# Patient Record
Sex: Female | Born: 1990 | Race: White | Hispanic: No | Marital: Married | Smoking: Never smoker
Health system: Southern US, Community
[De-identification: ages and names within clinical notes are randomized; demographics above are authoritative.]

## PROBLEM LIST (undated history)

## (undated) DIAGNOSIS — Z789 Other specified health status: Secondary | ICD-10-CM

## (undated) HISTORY — PX: NO PAST SURGERIES: SHX2092

---

## 2017-06-21 LAB — OB RESULTS CONSOLE GC/CHLAMYDIA
CHLAMYDIA, DNA PROBE: NEGATIVE
GC PROBE AMP, GENITAL: NEGATIVE

## 2017-06-21 LAB — OB RESULTS CONSOLE RUBELLA ANTIBODY, IGM: RUBELLA: IMMUNE

## 2017-06-21 LAB — OB RESULTS CONSOLE ANTIBODY SCREEN: ANTIBODY SCREEN: NEGATIVE

## 2017-06-21 LAB — OB RESULTS CONSOLE ABO/RH: RH Type: POSITIVE

## 2017-06-21 LAB — OB RESULTS CONSOLE HEPATITIS B SURFACE ANTIGEN: HEP B S AG: NEGATIVE

## 2017-06-21 LAB — OB RESULTS CONSOLE RPR: RPR: NONREACTIVE

## 2017-06-21 LAB — OB RESULTS CONSOLE HIV ANTIBODY (ROUTINE TESTING): HIV: NONREACTIVE

## 2017-07-13 ENCOUNTER — Other Ambulatory Visit (HOSPITAL_COMMUNITY)
Admission: RE | Admit: 2017-07-13 | Discharge: 2017-07-13 | Disposition: A | Discharge status: Home / Self Care | Payer: BLUE CROSS/BLUE SHIELD | Source: Ambulatory Visit | Attending: Obstetrics and Gynecology | Admitting: Obstetrics and Gynecology

## 2017-07-13 ENCOUNTER — Other Ambulatory Visit: Payer: Self-pay | Admitting: Obstetrics and Gynecology

## 2017-07-13 DIAGNOSIS — Z3400 Encounter for supervision of normal first pregnancy, unspecified trimester: Secondary | ICD-10-CM | POA: Insufficient documentation

## 2017-07-19 LAB — CYTOLOGY - PAP
Chlamydia: NEGATIVE
HPV (WINDOPATH): DETECTED
NEISSERIA GONORRHEA: NEGATIVE

## 2017-12-13 NOTE — L&D Delivery Note (Signed)
Delivery Note At 7:46 AM a viable female was delivered via Vaginal, Spontaneous (Presentation: ROA ).  APGAR: 8, 9; weight 7 lb 12.9 oz (3540 g).   Placenta status: L&D .  Cord:  with the following complications: none .  Cord pH: n/a  Anesthesia:  Epidural Episiotomy: Right Mediolateral Lacerations: 3rd degree (3b) with >50% of EAS torn.  Rectal exploration was performed- mucosa and internal sphincter intact.  Repair was performed in the usual fashion.  Interrupted suture of 3-0 vicryl was used for re-approximation of the EAS.  The remainder of the repair was performed in the usual fashion.  Pt was given IV Ancef x 1  Suture Repair: 2.0 3.0 vicryl Est. Blood Loss (mL): 250  Mom to postpartum.  Baby to Couplet care / Skin to Skin.  Sharon SellerJennifer M Sahir Tolson 02/09/2018, 9:51 AM

## 2018-01-03 LAB — OB RESULTS CONSOLE GBS: GBS: NEGATIVE

## 2018-02-03 ENCOUNTER — Encounter (HOSPITAL_COMMUNITY): Payer: Self-pay | Admitting: *Deleted

## 2018-02-03 ENCOUNTER — Telehealth (HOSPITAL_COMMUNITY): Payer: Self-pay | Admitting: *Deleted

## 2018-02-06 NOTE — Telephone Encounter (Signed)
Preadmission screen  

## 2018-02-07 ENCOUNTER — Ambulatory Visit (HOSPITAL_COMMUNITY)
Admission: RE | Admit: 2018-02-07 | Discharge: 2018-02-07 | Disposition: A | Discharge status: Home / Self Care | Payer: Managed Care, Other (non HMO) | Source: Ambulatory Visit | Attending: Obstetrics and Gynecology | Admitting: Obstetrics and Gynecology

## 2018-02-07 ENCOUNTER — Other Ambulatory Visit (HOSPITAL_COMMUNITY): Payer: Self-pay | Admitting: Obstetrics and Gynecology

## 2018-02-07 DIAGNOSIS — Z3A41 41 weeks gestation of pregnancy: Secondary | ICD-10-CM

## 2018-02-07 DIAGNOSIS — O480 Post-term pregnancy: Secondary | ICD-10-CM

## 2018-02-08 ENCOUNTER — Encounter (HOSPITAL_COMMUNITY): Payer: Self-pay

## 2018-02-08 ENCOUNTER — Inpatient Hospital Stay (HOSPITAL_COMMUNITY)
Admission: AD | Admit: 2018-02-08 | Discharge: 2018-02-08 | Disposition: A | Discharge status: Home / Self Care | Payer: Managed Care, Other (non HMO) | Source: Ambulatory Visit | Attending: Obstetrics & Gynecology | Admitting: Obstetrics & Gynecology

## 2018-02-08 ENCOUNTER — Inpatient Hospital Stay (HOSPITAL_COMMUNITY)
Admission: AD | Admit: 2018-02-08 | Discharge: 2018-02-11 | DRG: 768 | Disposition: A | Discharge status: Home / Self Care | Payer: Managed Care, Other (non HMO) | Source: Ambulatory Visit | Attending: Obstetrics and Gynecology | Admitting: Obstetrics and Gynecology

## 2018-02-08 ENCOUNTER — Inpatient Hospital Stay (HOSPITAL_COMMUNITY): Payer: Managed Care, Other (non HMO)

## 2018-02-08 DIAGNOSIS — O480 Post-term pregnancy: Secondary | ICD-10-CM | POA: Diagnosis present

## 2018-02-08 DIAGNOSIS — O288 Other abnormal findings on antenatal screening of mother: Secondary | ICD-10-CM

## 2018-02-08 DIAGNOSIS — Z3A41 41 weeks gestation of pregnancy: Secondary | ICD-10-CM | POA: Diagnosis not present

## 2018-02-08 HISTORY — DX: Other specified health status: Z78.9

## 2018-02-08 LAB — CBC
HEMATOCRIT: 38.8 % (ref 36.0–46.0)
Hemoglobin: 13.1 g/dL (ref 12.0–15.0)
MCH: 29.6 pg (ref 26.0–34.0)
MCHC: 33.8 g/dL (ref 30.0–36.0)
MCV: 87.8 fL (ref 78.0–100.0)
PLATELETS: 335 10*3/uL (ref 150–400)
RBC: 4.42 MIL/uL (ref 3.87–5.11)
RDW: 13.8 % (ref 11.5–15.5)
WBC: 19.4 10*3/uL — AB (ref 4.0–10.5)

## 2018-02-08 MED ORDER — HYDROXYZINE HCL 50 MG PO TABS
50.0000 mg | ORAL_TABLET | Freq: Four times a day (QID) | ORAL | Status: DC | PRN
  Filled 2018-02-08: qty 1

## 2018-02-08 MED ORDER — OXYCODONE-ACETAMINOPHEN 5-325 MG PO TABS: 2.0000 | ORAL_TABLET | ORAL | Status: DC | PRN

## 2018-02-08 MED ORDER — EPHEDRINE 5 MG/ML INJ
10.0000 mg | INTRAVENOUS | Status: DC | PRN
  Filled 2018-02-08: qty 2

## 2018-02-08 MED ORDER — PHENYLEPHRINE 40 MCG/ML (10ML) SYRINGE FOR IV PUSH (FOR BLOOD PRESSURE SUPPORT)
80.0000 ug | PREFILLED_SYRINGE | INTRAVENOUS | Status: DC | PRN
  Filled 2018-02-08: qty 5

## 2018-02-08 MED ORDER — LIDOCAINE HCL (PF) 1 % IJ SOLN
30.0000 mL | INTRAMUSCULAR | Status: DC | PRN
  Filled 2018-02-08: qty 30

## 2018-02-08 MED ORDER — LACTATED RINGERS IV SOLN
500.0000 mL | Freq: Once | INTRAVENOUS | Status: AC
  Administered 2018-02-08: 500 mL via INTRAVENOUS

## 2018-02-08 MED ORDER — OXYCODONE-ACETAMINOPHEN 5-325 MG PO TABS: 1.0000 | ORAL_TABLET | ORAL | Status: DC | PRN

## 2018-02-08 MED ORDER — ONDANSETRON HCL 4 MG/2ML IJ SOLN: 4.0000 mg | Freq: Four times a day (QID) | INTRAMUSCULAR | Status: DC | PRN

## 2018-02-08 MED ORDER — FENTANYL 2.5 MCG/ML BUPIVACAINE 1/10 % EPIDURAL INFUSION (WH - ANES)
14.0000 mL/h | INTRAMUSCULAR | Status: DC | PRN
  Administered 2018-02-09: 14 mL/h via EPIDURAL
  Filled 2018-02-08: qty 100

## 2018-02-08 MED ORDER — LACTATED RINGERS IV SOLN
INTRAVENOUS | Status: DC
  Administered 2018-02-08: via INTRAVENOUS

## 2018-02-08 MED ORDER — OXYTOCIN BOLUS FROM INFUSION
500.0000 mL | Freq: Once | INTRAVENOUS | Status: AC
  Administered 2018-02-09: 500 mL via INTRAVENOUS

## 2018-02-08 MED ORDER — LACTATED RINGERS IV SOLN: 500.0000 mL | INTRAVENOUS | Status: DC | PRN

## 2018-02-08 MED ORDER — ACETAMINOPHEN 325 MG PO TABS: 650.0000 mg | ORAL_TABLET | ORAL | Status: DC | PRN

## 2018-02-08 MED ORDER — BUTORPHANOL TARTRATE 1 MG/ML IJ SOLN: 1.0000 mg | INTRAMUSCULAR | Status: DC | PRN

## 2018-02-08 MED ORDER — PHENYLEPHRINE 40 MCG/ML (10ML) SYRINGE FOR IV PUSH (FOR BLOOD PRESSURE SUPPORT)
80.0000 ug | PREFILLED_SYRINGE | INTRAVENOUS | Status: DC | PRN
  Filled 2018-02-08: qty 10
  Filled 2018-02-08: qty 5

## 2018-02-08 MED ORDER — DIPHENHYDRAMINE HCL 50 MG/ML IJ SOLN: 12.5000 mg | INTRAMUSCULAR | Status: DC | PRN

## 2018-02-08 MED ORDER — OXYTOCIN 40 UNITS IN LACTATED RINGERS INFUSION - SIMPLE MED
2.5000 [IU]/h | INTRAVENOUS | Status: DC
  Filled 2018-02-08: qty 1000

## 2018-02-08 MED ORDER — SOD CITRATE-CITRIC ACID 500-334 MG/5ML PO SOLN: 30.0000 mL | ORAL | Status: DC | PRN

## 2018-02-08 NOTE — MAU Note (Signed)
Pt reports worsening contractions every 3-5 mins apart. Pt denies LOF. Reports bloody show. Reports good fetal movement. Cervix was 2cm

## 2018-02-08 NOTE — MAU Note (Signed)
Pt reports contractions that became more consistent at 9pm. States they are every 3 mins. Pt denies LOF. Reports some spotting earlier in the night, but none now. Reports good fetal movement. States was 1.5cm in office today. Scheduled for induction on Friday

## 2018-02-08 NOTE — MAU Note (Signed)
I have communicated with Sue Thornton, CNM and reviewed vital signs:  Vitals:   02/08/18 0050 02/08/18 0410  BP: 123/75 124/70  Pulse: (!) 105   Resp: 20   Temp: 98 F (36.7 C)   SpO2: 100%     Vaginal exam:  Dilation: 2 Effacement (%): 80 Cervical Position: Middle Station: -1 Presentation: Vertex Exam by:: Roxan Hockeyraci Saroya Riccobono RN,   Also reviewed contraction pattern and that non-stress test is reactive.  It has been documented that patient is contracting every 2-7  minutes with 1/2 cm cervical change over 3  hours not indicating active labor.  Patient denies any other complaints.  Based on this report provider has given order for discharge.  A discharge order and diagnosis entered by a provider.   Labor discharge instructions reviewed with patient.

## 2018-02-08 NOTE — Discharge Instructions (Signed)
Braxton Wilcoxson Contractions °Contractions of the uterus can occur throughout pregnancy, but they are not always a sign that you are in labor. You may have practice contractions called Braxton Resurreccion contractions. These false labor contractions are sometimes confused with true labor. °What are Braxton Asaro contractions? °Braxton Bloomquist contractions are tightening movements that occur in the muscles of the uterus before labor. Unlike true labor contractions, these contractions do not result in opening (dilation) and thinning of the cervix. Toward the end of pregnancy (32-34 weeks), Braxton Gadison contractions can happen more often and may become stronger. These contractions are sometimes difficult to tell apart from true labor because they can be very uncomfortable. You should not feel embarrassed if you go to the hospital with false labor. °Sometimes, the only way to tell if you are in true labor is for your health care provider to look for changes in the cervix. The health care provider will do a physical exam and may monitor your contractions. If you are not in true labor, the exam should show that your cervix is not dilating and your water has not broken. °If there are other health problems associated with your pregnancy, it is completely safe for you to be sent home with false labor. You may continue to have Braxton Croke contractions until you go into true labor. °How to tell the difference between true labor and false labor °True labor °· Contractions last 30-70 seconds. °· Contractions become very regular. °· Discomfort is usually felt in the top of the uterus, and it spreads to the lower abdomen and low back. °· Contractions do not go away with walking. °· Contractions usually become more intense and increase in frequency. °· The cervix dilates and gets thinner. °False labor °· Contractions are usually shorter and not as strong as true labor contractions. °· Contractions are usually irregular. °· Contractions  are often felt in the front of the lower abdomen and in the groin. °· Contractions may go away when you walk around or change positions while lying down. °· Contractions get weaker and are shorter-lasting as time goes on. °· The cervix usually does not dilate or become thin. °Follow these instructions at home: °· Take over-the-counter and prescription medicines only as told by your health care provider. °· Keep up with your usual exercises and follow other instructions from your health care provider. °· Eat and drink lightly if you think you are going into labor. °· If Braxton Alguire contractions are making you uncomfortable: °? Change your position from lying down or resting to walking, or change from walking to resting. °? Sit and rest in a tub of warm water. °? Drink enough fluid to keep your urine pale yellow. Dehydration may cause these contractions. °? Do slow and deep breathing several times an hour. °· Keep all follow-up prenatal visits as told by your health care provider. This is important. °Contact a health care provider if: °· You have a fever. °· You have continuous pain in your abdomen. °Get help right away if: °· Your contractions become stronger, more regular, and closer together. °· You have fluid leaking or gushing from your vagina. °· You pass blood-tinged mucus (bloody show). °· You have bleeding from your vagina. °· You have low back pain that you never had before. °· You feel your baby’s head pushing down and causing pelvic pressure. °· Your baby is not moving inside you as much as it used to. °Summary °· Contractions that occur before labor are called Braxton   Feider contractions, false labor, or practice contractions. °· Braxton Botsford contractions are usually shorter, weaker, farther apart, and less regular than true labor contractions. True labor contractions usually become progressively stronger and regular and they become more frequent. °· Manage discomfort from Braxton Canino contractions by  changing position, resting in a warm bath, drinking plenty of water, or practicing deep breathing. °This information is not intended to replace advice given to you by your health care provider. Make sure you discuss any questions you have with your health care provider. °Document Released: 04/14/2017 Document Revised: 04/14/2017 Document Reviewed: 04/14/2017 °Elsevier Interactive Patient Education © 2018 [employer] ° °

## 2018-02-09 ENCOUNTER — Encounter (HOSPITAL_COMMUNITY): Payer: Self-pay

## 2018-02-09 ENCOUNTER — Inpatient Hospital Stay (HOSPITAL_COMMUNITY): Payer: Managed Care, Other (non HMO) | Admitting: Anesthesiology

## 2018-02-09 LAB — ABO/RH: ABO/RH(D): O POS

## 2018-02-09 LAB — TYPE AND SCREEN
ABO/RH(D): O POS
ANTIBODY SCREEN: NEGATIVE

## 2018-02-09 LAB — RPR: RPR Ser Ql: NONREACTIVE

## 2018-02-09 MED ORDER — OXYTOCIN 40 UNITS IN LACTATED RINGERS INFUSION - SIMPLE MED
1.0000 m[IU]/min | INTRAVENOUS | Status: DC
  Administered 2018-02-09: 2 m[IU]/min via INTRAVENOUS

## 2018-02-09 MED ORDER — CEFAZOLIN SODIUM-DEXTROSE 2-4 GM/100ML-% IV SOLN
2.0000 g | Freq: Once | INTRAVENOUS | Status: AC
  Administered 2018-02-09: 2 g via INTRAVENOUS
  Filled 2018-02-09: qty 100

## 2018-02-09 MED ORDER — PRENATAL MULTIVITAMIN CH
1.0000 | ORAL_TABLET | Freq: Every day | ORAL | Status: DC
  Administered 2018-02-09 – 2018-02-11 (×3): 1 via ORAL
  Filled 2018-02-09 (×3): qty 1

## 2018-02-09 MED ORDER — ACETAMINOPHEN 325 MG PO TABS
650.0000 mg | ORAL_TABLET | ORAL | Status: DC | PRN
  Administered 2018-02-09 (×2): 650 mg via ORAL
  Filled 2018-02-09 (×2): qty 2

## 2018-02-09 MED ORDER — SENNOSIDES-DOCUSATE SODIUM 8.6-50 MG PO TABS
2.0000 | ORAL_TABLET | Freq: Two times a day (BID) | ORAL | Status: DC
  Administered 2018-02-09 – 2018-02-11 (×5): 2 via ORAL
  Filled 2018-02-09 (×5): qty 2

## 2018-02-09 MED ORDER — ONDANSETRON HCL 4 MG/2ML IJ SOLN: 4.0000 mg | INTRAMUSCULAR | Status: DC | PRN

## 2018-02-09 MED ORDER — LIDOCAINE HCL (PF) 1 % IJ SOLN
INTRAMUSCULAR | Status: DC | PRN
  Administered 2018-02-09 (×2): 5 mL

## 2018-02-09 MED ORDER — TERBUTALINE SULFATE 1 MG/ML IJ SOLN
0.2500 mg | Freq: Once | INTRAMUSCULAR | Status: DC | PRN
  Filled 2018-02-09: qty 1

## 2018-02-09 MED ORDER — ZOLPIDEM TARTRATE 5 MG PO TABS: 5.0000 mg | ORAL_TABLET | Freq: Every evening | ORAL | Status: DC | PRN

## 2018-02-09 MED ORDER — OXYCODONE HCL 5 MG PO TABS: 10.0000 mg | ORAL_TABLET | ORAL | Status: DC | PRN

## 2018-02-09 MED ORDER — COCONUT OIL OIL: 1.0000 "application " | TOPICAL_OIL | Status: DC | PRN

## 2018-02-09 MED ORDER — DIPHENHYDRAMINE HCL 25 MG PO CAPS: 25.0000 mg | ORAL_CAPSULE | Freq: Four times a day (QID) | ORAL | Status: DC | PRN

## 2018-02-09 MED ORDER — DIBUCAINE 1 % RE OINT: 1.0000 "application " | TOPICAL_OINTMENT | RECTAL | Status: DC | PRN

## 2018-02-09 MED ORDER — ONDANSETRON HCL 4 MG PO TABS: 4.0000 mg | ORAL_TABLET | ORAL | Status: DC | PRN

## 2018-02-09 MED ORDER — WITCH HAZEL-GLYCERIN EX PADS: 1.0000 "application " | MEDICATED_PAD | CUTANEOUS | Status: DC | PRN

## 2018-02-09 MED ORDER — BENZOCAINE-MENTHOL 20-0.5 % EX AERO
1.0000 "application " | INHALATION_SPRAY | CUTANEOUS | Status: DC | PRN
  Administered 2018-02-09: 1 via TOPICAL
  Filled 2018-02-09: qty 56

## 2018-02-09 MED ORDER — SIMETHICONE 80 MG PO CHEW: 80.0000 mg | CHEWABLE_TABLET | ORAL | Status: DC | PRN

## 2018-02-09 MED ORDER — OXYCODONE HCL 5 MG PO TABS: 5.0000 mg | ORAL_TABLET | ORAL | Status: DC | PRN

## 2018-02-09 MED ORDER — IBUPROFEN 600 MG PO TABS
600.0000 mg | ORAL_TABLET | Freq: Four times a day (QID) | ORAL | Status: DC
  Administered 2018-02-09 – 2018-02-11 (×8): 600 mg via ORAL
  Filled 2018-02-09 (×9): qty 1

## 2018-02-09 NOTE — Anesthesia Procedure Notes (Signed)
Epidural Patient location during procedure: OB  Staffing Anesthesiologist: Ridley Schewe, MD Performed: anesthesiologist   Preanesthetic Checklist Completed: patient identified, site marked, surgical consent, pre-op evaluation, timeout performed, IV checked, risks and benefits discussed and monitors and equipment checked  Epidural Patient position: sitting Prep: DuraPrep Patient monitoring: heart rate, continuous pulse ox and blood pressure Approach: right paramedian Location: L3-L4 Injection technique: LOR saline  Needle:  Needle type: Tuohy  Needle gauge: 17 G Needle length: 9 cm and 9 Needle insertion depth: 6 cm Catheter type: closed end flexible Catheter size: 20 Guage Catheter at skin depth: 10 cm Test dose: negative  Assessment Events: blood not aspirated, injection not painful, no injection resistance, negative IV test and no paresthesia  Additional Notes Patient identified. Risks/Benefits/Options discussed with patient including but not limited to bleeding, infection, nerve damage, paralysis, failed block, incomplete pain control, headache, blood pressure changes, nausea, vomiting, reactions to medication both or allergic, itching and postpartum back pain. Confirmed with bedside nurse the patient's most recent platelet count. Confirmed with patient that they are not currently taking any anticoagulation, have any bleeding history or any family history of bleeding disorders. Patient expressed understanding and wished to proceed. All questions were answered. Sterile technique was used throughout the entire procedure. Please see nursing notes for vital signs. Test dose was given through epidural needle and negative prior to continuing to dose epidural or start infusion. Warning signs of high block given to the patient including shortness of breath, tingling/numbness in hands, complete motor block, or any concerning symptoms with instructions to call for help. Patient was given  instructions on fall risk and not to get out of bed. All questions and concerns addressed with instructions to call with any issues.     

## 2018-02-09 NOTE — Anesthesia Postprocedure Evaluation (Signed)
Anesthesia Post Note  Patient: Sue Thornton  Procedure(s) Performed: AN AD HOC LABOR EPIDURAL     Patient location during evaluation: Mother Baby Anesthesia Type: Epidural Level of consciousness: awake and alert and oriented Pain management: satisfactory to patient Vital Signs Assessment: post-procedure vital signs reviewed and stable Respiratory status: respiratory function stable Cardiovascular status: stable Postop Assessment: no headache, no backache, epidural receding, patient able to bend at knees, no signs of nausea or vomiting and adequate PO intake Anesthetic complications: no    Last Vitals:  Vitals:   02/09/18 1000 02/09/18 1059  BP: 119/60 (!) 119/57  Pulse: 74 64  Resp: 18 18  Temp: 37.2 C 37.2 C  SpO2:      Last Pain:  Vitals:   02/09/18 1059  TempSrc: Oral  PainSc: 3    Pain Goal: Patients Stated Pain Goal: 3 (02/09/18 0000)               Karleen DolphinFUSSELL,Jarrod Mcenery

## 2018-02-09 NOTE — Lactation Note (Signed)
This note was copied from a baby's chart. Lactation Consultation Note  Patient Name: Sue Thornton ZOXWR'UToday's Date: 02/09/2018 Reason for consult: Initial assessment;Difficult latch   P1 mom states infant has not fed in 12 hours due to being sleepy.  Mom  States infant fed well to right side this morning but cannot latch to left.  LC worked with mom on hand expressing and unswaddled infant.  Mom was able to hand express and LC used gloved finger to assess suck.  Infant would not suck finger; palate was high, infant as tongue sucking.  Suck training used and LC assisted mom with more hand expressing: 6 ml into 2 spoons.  LC demonstrated spoon feeding to infant, taught parents how to hold infant spoon and to watch for tongue extension.  Infant finally began extending her tongue and took in the expressed colostrum.  Parents were given a snappy to collect colostrum and spoon fed to infant prior to or after breastfeed.  After spoon feeding LC assisted mom in bf with cross cradle position.  LC instructed dad to teacup hold breast tissue while mom sandwiched breast.  This would give infant extra tissue to grasp; mom has everted but very short shafted nipples and baby has difficulty latching.  But did latch deep and well with extra breast support.  Mom taught to use compression and massage and to keep infant awake at breast.  With compressions swallows were heard and LC made sure mom and dad could recognize these.  Mom was taught to listen for swallows when infant feeds.  NS is a possibility; with DEBP,  if in future mom cannot latch infant or has much difficulty latching with future feeds.  Now infant is 12 hours and is latched well and spoon fed well.  Mom knows to hand express before and after feeds and to call out for assistance if unable to get infant latched.  BF basics reviewed: feed infant at least 8-12 times in a 24 hour period.  Bf brochure given to parents and resource sheet and bfsg info shared.       Maternal Data Formula Feeding for Exclusion: No Has patient been taught Hand Expression?: Yes(hand expressed 6 ml colostrum spoon fed to infant) Does the patient have breastfeeding experience prior to this delivery?: No  Feeding Feeding Type: Breast Fed Length of feed: 15 min  LATCH Score Latch: Repeated attempts needed to sustain latch, nipple held in mouth throughout feeding, stimulation needed to elicit sucking reflex.(unable to latch without teacup hold)  Audible Swallowing: A few with stimulation  Type of Nipple: Everted at rest and after stimulation(very short shaft, difficult for infant to grasp without good sandwiching and teacup hold)  Comfort (Breast/Nipple): Soft / non-tender  Hold (Positioning): Assistance needed to correctly position infant at breast and maintain latch.  LATCH Score: 7  Interventions Interventions: Breast feeding basics reviewed;Assisted with latch;Skin to skin;Breast massage;Hand express;Adjust position;Expressed milk;Position options;Support pillows  Lactation Tools Discussed/Used Tools: Other (comment)(spoon)   Consult Status Consult Status: Follow-up Date: 02/10/18 Follow-up type: In-patient    Maryruth HancockKelly [facility] 02/09/2018, 9:11 PM

## 2018-02-09 NOTE — Anesthesia Postprocedure Evaluation (Signed)
Anesthesia Post Note  Patient: Sue Thornton  Procedure(s) Performed: AN AD HOC LABOR EPIDURAL     Patient location during evaluation: Mother Baby Anesthesia Type: Epidural Level of consciousness: awake and alert Pain management: pain level controlled Vital Signs Assessment: post-procedure vital signs reviewed and stable Respiratory status: spontaneous breathing, nonlabored ventilation and respiratory function stable Cardiovascular status: stable Postop Assessment: no headache, no backache and epidural receding Anesthetic complications: no    Last Vitals:  Vitals:   02/09/18 0700 02/09/18 0815  BP: (!) 148/79 (!) 125/55  Pulse: (!) 125 (!) 109  Resp: (!) 22 16  Temp:    SpO2: 98%     Last Pain:  Vitals:   02/09/18 0700  TempSrc:   PainSc: 7    Pain Goal: Patients Stated Pain Goal: 3 (02/09/18 0000)               Miesha Bachmann

## 2018-02-09 NOTE — Addendum Note (Signed)
Addendum  created 02/09/18 1402 by Danni Shima O, CRNA   Charge Capture section accepted, Sign clinical note    

## 2018-02-09 NOTE — Progress Notes (Signed)
Informed by RN that SROM at ~0233.  Moderate meconium noted.  Forebag palpated.  Contractions spaced out. In to AROM.  Pt comfortable. Category I tracing. Contractions irregular.   AROM moderate meconium Cervix 5-6/100/0 after AROM Start Pitocin. Pt updated on management. Dr. Charlotta Newtonzan to assume care at 7 am.

## 2018-02-09 NOTE — Anesthesia Preprocedure Evaluation (Signed)

## 2018-02-09 NOTE — H&P (Signed)
Sue Thornton is a 27 y.o. female presenting for contractions.  Pt discharged yesterday early am in early labor at 2 cm.  Pt reports contractions were painful all day.  Frequency increased. Bloody show noted. [facility] Pt now comfortable s/p epidural.  OB History    Gravida Para Term Preterm AB Living   1             SAB TAB Ectopic Multiple Live Births                 Past Medical History:  Diagnosis Date  . Medical history non-contributory    Past Surgical History:  Procedure Laterality Date  . NO PAST SURGERIES     Family History: family history includes Asthma in her sister; Breast cancer in her maternal aunt and maternal grandmother; Hypertension in her father; Thyroid disease in her mother. Social History:  reports that  has never smoked. she has never used smokeless tobacco. She reports that she does not drink alcohol or use drugs.     Maternal Diabetes: No Genetic Screening: Declined Maternal Ultrasounds/Referrals: Normal Fetal Ultrasounds or other Referrals:  None Maternal Substance Abuse:  No Significant Maternal Medications:  None Significant Maternal Lab Results:  Lab values include: Group B Strep negative Other Comments:  None  Review of Systems  Gastrointestinal: Positive for abdominal pain.   History Dilation: 3.5 Effacement (%): 100 Station: -1 Exam by:: Doristine Shehan MD Blood pressure (!) 102/55, pulse (!) 113, temperature 98.3 F (36.8 C), temperature source Oral, resp. rate 16, height 5\' 8"  (1.727 m), weight 84.4 kg (186 lb), SpO2 99 %. Exam Physical Exam  Prenatal labs: ABO, Rh: --/--/O POS (02/27 2323) Antibody: NEG (02/27 2323) Rubella: Immune (07/10 0000) RPR: Nonreactive (07/10 0000)  HBsAg: Negative (07/10 0000)  HIV: Non-reactive (07/10 0000)  GBS: Negative (01/22 0000)   Assessment/Plan: IUP @ 41 2/7 weeks Spontaneous labor Category I tracing. Postdates. Observe for 2 hours then recheck  cervix.  Start Pitocin if minimal change. S/p Epidural.   Sue RankinsEvelyn Corbyn Thornton 02/09/2018, 1:02 AM

## 2018-02-10 ENCOUNTER — Inpatient Hospital Stay (HOSPITAL_COMMUNITY): Admission: RE | Admit: 2018-02-10 | Payer: BLUE CROSS/BLUE SHIELD | Source: Ambulatory Visit

## 2018-02-10 ENCOUNTER — Other Ambulatory Visit: Payer: Self-pay

## 2018-02-10 LAB — CBC
HEMATOCRIT: 32.3 % — AB (ref 36.0–46.0)
HEMOGLOBIN: 10.6 g/dL — AB (ref 12.0–15.0)
MCH: 29 pg (ref 26.0–34.0)
MCHC: 32.8 g/dL (ref 30.0–36.0)
MCV: 88.5 fL (ref 78.0–100.0)
Platelets: 254 10*3/uL (ref 150–400)
RBC: 3.65 MIL/uL — AB (ref 3.87–5.11)
RDW: 14.2 % (ref 11.5–15.5)
WBC: 12.3 10*3/uL — ABNORMAL HIGH (ref 4.0–10.5)

## 2018-02-10 NOTE — Progress Notes (Signed)
Post Partum Day 1 Subjective: no complaints, up ad lib, voiding and tolerating PO  Objective: Blood pressure 104/63, pulse 84, temperature 98.2 F (36.8 C), temperature source Oral, resp. rate 18, height 5\' 8"  (1.727 m), weight 84.4 kg (186 lb), SpO2 98 %, unknown if currently breastfeeding.  Physical Exam:  General: alert, cooperative and no distress Lochia: appropriate Uterine Fundus: firm Incision: NA DVT Evaluation: No evidence of DVT seen on physical exam.  Recent Labs    02/08/18 2323 02/10/18 0525  HGB 13.1 10.6*  HCT 38.8 32.3*    Assessment/Plan: Plan for discharge tomorrow and Breastfeeding  Routine postpartum care    LOS: 2 days   Merrell Rettinger J. 02/10/2018, 5:50 PM

## 2018-02-11 MED ORDER — IBUPROFEN 600 MG PO TABS: 600.0000 mg | ORAL_TABLET | Freq: Four times a day (QID) | ORAL | 0 refills | Status: DC | PRN

## 2018-02-11 NOTE — Discharge Summary (Signed)
OB Discharge Summary     Patient Name: Sue Thornton DOB: Aug 13, 1991 MRN: [id number]  Date of admission: 02/08/2018 Delivering MD: Myna Hidalgo   Date of discharge: 02/11/2018  Admitting diagnosis: 41WKS CTX Intrauterine pregnancy: [redacted]w[redacted]d     Secondary diagnosis:  Active Problems:   Normal labor  Additional problems: none     Discharge diagnosis: Term Pregnancy Delivered                                                                                                Post partum procedures:none  Augmentation: AROM  Complications: None  Hospital course:  Onset of Labor With Vaginal Delivery     27 y.o. yo G1P1001 at [redacted]w[redacted]d was admitted in Active Labor on 02/08/2018. Patient had an uncomplicated labor course as follows:  Membrane Rupture Time/Date: 4:30 AM ,02/09/2018   Intrapartum Procedures: Episiotomy: Right Mediolateral [4]                                         Lacerations:  3rd degree [4];Perineal [11]  Patient had a delivery of a Viable infant. 02/09/2018  Information for the patient's newborn:  Cheetara, Hoge [[id number]]  Delivery Method: Vaginal, Spontaneous(Filed from Delivery Summary)    Pateint had an uncomplicated postpartum course.  She is ambulating, tolerating a regular diet, passing flatus, and urinating well. Patient is discharged home in stable condition on 02/11/18.   Physical exam  Vitals:   02/10/18 0016 02/10/18 0549 02/10/18 1831 02/11/18 0947  BP:    125/69  Pulse: 96 84 92 94  Resp: 18 18 17 16   Temp: 98.7 F (37.1 C) 98.2 F (36.8 C) 98.3 F (36.8 C) 98.4 F (36.9 C)  TempSrc: Oral Oral Oral Oral  SpO2:   99%   Weight:      Height:       General: alert, cooperative and no distress Lochia: appropriate Uterine Fundus: firm Incision: N/A DVT Evaluation: No evidence of DVT seen on physical exam. Labs: Lab Results  Component Value Date   WBC 12.3 (H) 02/10/2018   HGB 10.6 (L) 02/10/2018   HCT 32.3 (L) 02/10/2018   MCV 88.5 02/10/2018    PLT 254 02/10/2018   No flowsheet data found.  Discharge instruction: per After Visit Summary and "Baby and Me Booklet".  After visit meds:  Allergies as of 02/11/2018   No Known Allergies     Medication List    TAKE these medications   ibuprofen 600 MG tablet Commonly known as:  ADVIL,MOTRIN Take 1 tablet (600 mg total) by mouth every 6 (six) hours as needed.   ondansetron 4 MG disintegrating tablet Commonly known as:  ZOFRAN-ODT Take 4 mg by mouth every 8 (eight) hours as needed for nausea or vomiting.   prenatal multivitamin Tabs tablet Take 1 tablet by mouth daily at 12 noon.   ranitidine 150 MG tablet Commonly known as:  ZANTAC Take 150 mg by mouth 2 (two) times daily as needed for heartburn.  Diet: routine diet  Activity: Advance as tolerated. Pelvic rest for 6 weeks.   Outpatient follow up:6 weeks Follow up Appt:No future appointments. Follow up Visit:No Follow-up on file.  Postpartum contraception: Not Discussed  Newborn Data: Live born female  Birth Weight: 7 lb 12.9 oz (3540 g) APGAR: 8, 9  Newborn Delivery   Birth date/time:  02/09/2018 07:46:00 Delivery type:  Vaginal, Spontaneous     Baby Feeding: Breast Disposition:home with mother   02/11/2018 Jessee AversOLE,Audry Pecina J., MD

## 2018-02-11 NOTE — Lactation Note (Signed)
This note was copied from a baby's chart. Lactation Consultation Note  Patient Name: Sue Thornton  Mom states latching and feedings are going much better.  Discussed milk coming to volume and engorgement prevention and treatment.  Questions answered regarding pumping and introducing a bottle.  Lactation outpatient services and support reviewed and encouraged prn.   Maternal Data    Feeding Feeding Type: Breast Fed Length of feed: 20 min  LATCH Score                   Interventions    Lactation Tools Discussed/Used     Consult Status      Huston FoleyMOULDEN, Mitch Arquette [name], 9:51 AM

## 2018-08-25 ENCOUNTER — Other Ambulatory Visit (HOSPITAL_COMMUNITY)
Admission: RE | Admit: 2018-08-25 | Discharge: 2018-08-25 | Disposition: A | Discharge status: Home / Self Care | Payer: Managed Care, Other (non HMO) | Source: Ambulatory Visit | Attending: Obstetrics & Gynecology | Admitting: Obstetrics & Gynecology

## 2018-08-25 ENCOUNTER — Other Ambulatory Visit: Payer: Self-pay | Admitting: Obstetrics and Gynecology

## 2018-08-25 DIAGNOSIS — Z01411 Encounter for gynecological examination (general) (routine) with abnormal findings: Secondary | ICD-10-CM | POA: Diagnosis not present

## 2018-08-30 LAB — CYTOLOGY - PAP: DIAGNOSIS: NEGATIVE

## 2018-11-21 LAB — OB RESULTS CONSOLE GC/CHLAMYDIA
Chlamydia: NEGATIVE
Gonorrhea: NEGATIVE

## 2018-11-21 LAB — OB RESULTS CONSOLE ABO/RH: RH Type: POSITIVE

## 2018-11-21 LAB — OB RESULTS CONSOLE RPR: RPR: NONREACTIVE

## 2018-11-21 LAB — OB RESULTS CONSOLE ANTIBODY SCREEN: Antibody Screen: NEGATIVE

## 2018-11-21 LAB — OB RESULTS CONSOLE HEPATITIS B SURFACE ANTIGEN: Hepatitis B Surface Ag: NEGATIVE

## 2018-11-21 LAB — OB RESULTS CONSOLE HIV ANTIBODY (ROUTINE TESTING): HIV: NONREACTIVE

## 2018-11-21 LAB — OB RESULTS CONSOLE RUBELLA ANTIBODY, IGM: Rubella: IMMUNE

## 2018-12-13 NOTE — L&D Delivery Note (Signed)
Delivery Note Pt labored quickly to complete with urge to push. Comfortable with epidural. She pushed for about 56mins and at 12:24 PM a viable female was delivered via Vaginal, Spontaneous (Presentation:ROA ;  ).  APGAR: 8, 9; weight pending .   Placenta status: delivered intact, .  Cord:3vc  with the following complications: nuchal x 2.  Cord pH: n/a  Anesthesia:  Epidural Episiotomy: None Lacerations: 2nd degree Suture Repair: 2.0 vicryl Est. Blood Loss (mL): 100  Mom to postpartum.  Baby to Couplet care / Skin to Skin  Circ with peds.  Sue Thornton 06/25/2019, 12:45 PM

## 2019-06-01 LAB — OB RESULTS CONSOLE GBS: GBS: NEGATIVE

## 2019-06-15 ENCOUNTER — Telehealth (HOSPITAL_COMMUNITY): Payer: Self-pay | Admitting: *Deleted

## 2019-06-15 ENCOUNTER — Encounter (HOSPITAL_COMMUNITY): Payer: Self-pay | Admitting: *Deleted

## 2019-06-15 NOTE — Telephone Encounter (Signed)
Preadmission screen  

## 2019-06-18 ENCOUNTER — Telehealth (HOSPITAL_COMMUNITY): Payer: Self-pay | Admitting: *Deleted

## 2019-06-18 ENCOUNTER — Encounter (HOSPITAL_COMMUNITY): Payer: Self-pay | Admitting: *Deleted

## 2019-06-18 NOTE — Telephone Encounter (Signed)
Preadmission screen  

## 2019-06-21 ENCOUNTER — Other Ambulatory Visit (HOSPITAL_COMMUNITY)
Admission: RE | Admit: 2019-06-21 | Discharge: 2019-06-21 | Disposition: A | Discharge status: Home / Self Care | Payer: BC Managed Care – PPO | Source: Ambulatory Visit | Attending: Obstetrics and Gynecology | Admitting: Obstetrics and Gynecology

## 2019-06-21 ENCOUNTER — Other Ambulatory Visit: Payer: Self-pay

## 2019-06-21 DIAGNOSIS — Z01812 Encounter for preprocedural laboratory examination: Secondary | ICD-10-CM | POA: Insufficient documentation

## 2019-06-21 DIAGNOSIS — Z1159 Encounter for screening for other viral diseases: Secondary | ICD-10-CM | POA: Insufficient documentation

## 2019-06-21 NOTE — MAU Note (Signed)
Covid swab collected. Pt tolerated well. Pt asymptomatic 

## 2019-06-22 LAB — SARS CORONAVIRUS 2 (TAT 6-24 HRS): SARS Coronavirus 2: NEGATIVE

## 2019-06-25 ENCOUNTER — Inpatient Hospital Stay (HOSPITAL_COMMUNITY): Payer: BC Managed Care – PPO | Admitting: Anesthesiology

## 2019-06-25 ENCOUNTER — Inpatient Hospital Stay (HOSPITAL_COMMUNITY): Payer: BC Managed Care – PPO

## 2019-06-25 ENCOUNTER — Encounter (HOSPITAL_COMMUNITY): Payer: Self-pay

## 2019-06-25 ENCOUNTER — Inpatient Hospital Stay (HOSPITAL_COMMUNITY)
Admission: AD | Admit: 2019-06-25 | Discharge: 2019-06-26 | DRG: 807 | Disposition: A | Discharge status: Home / Self Care | Payer: BC Managed Care – PPO | Attending: Obstetrics and Gynecology | Admitting: Obstetrics and Gynecology

## 2019-06-25 ENCOUNTER — Other Ambulatory Visit: Payer: Self-pay

## 2019-06-25 DIAGNOSIS — Z3A40 40 weeks gestation of pregnancy: Secondary | ICD-10-CM

## 2019-06-25 DIAGNOSIS — O480 Post-term pregnancy: Secondary | ICD-10-CM | POA: Diagnosis present

## 2019-06-25 LAB — CBC
HCT: 36 % (ref 36.0–46.0)
Hemoglobin: 11.6 g/dL — ABNORMAL LOW (ref 12.0–15.0)
MCH: 28.6 pg (ref 26.0–34.0)
MCHC: 32.2 g/dL (ref 30.0–36.0)
MCV: 88.7 fL (ref 80.0–100.0)
Platelets: 274 10*3/uL (ref 150–400)
RBC: 4.06 MIL/uL (ref 3.87–5.11)
RDW: 13.2 % (ref 11.5–15.5)
WBC: 11 10*3/uL — ABNORMAL HIGH (ref 4.0–10.5)
nRBC: 0 % (ref 0.0–0.2)

## 2019-06-25 LAB — RPR: RPR Ser Ql: NONREACTIVE

## 2019-06-25 LAB — ABO/RH: ABO/RH(D): O POS

## 2019-06-25 LAB — TYPE AND SCREEN
ABO/RH(D): O POS
Antibody Screen: NEGATIVE

## 2019-06-25 MED ORDER — ONDANSETRON HCL 4 MG PO TABS: 4.0000 mg | ORAL_TABLET | ORAL | Status: DC | PRN

## 2019-06-25 MED ORDER — OXYCODONE HCL 5 MG PO TABS: 5.0000 mg | ORAL_TABLET | ORAL | Status: DC | PRN

## 2019-06-25 MED ORDER — OXYTOCIN BOLUS FROM INFUSION: 500.0000 mL | Freq: Once | INTRAVENOUS | Status: DC

## 2019-06-25 MED ORDER — FENTANYL-BUPIVACAINE-NACL 0.5-0.125-0.9 MG/250ML-% EP SOLN
EPIDURAL | Status: AC
  Filled 2019-06-25: qty 250

## 2019-06-25 MED ORDER — LACTATED RINGERS IV SOLN: 500.0000 mL | INTRAVENOUS | Status: DC | PRN

## 2019-06-25 MED ORDER — PHENYLEPHRINE 40 MCG/ML (10ML) SYRINGE FOR IV PUSH (FOR BLOOD PRESSURE SUPPORT): 80.0000 ug | PREFILLED_SYRINGE | INTRAVENOUS | Status: DC | PRN

## 2019-06-25 MED ORDER — SIMETHICONE 80 MG PO CHEW: 80.0000 mg | CHEWABLE_TABLET | ORAL | Status: DC | PRN

## 2019-06-25 MED ORDER — LIDOCAINE HCL (PF) 1 % IJ SOLN
INTRAMUSCULAR | Status: DC | PRN
  Administered 2019-06-25: 5 mL via EPIDURAL

## 2019-06-25 MED ORDER — SENNOSIDES-DOCUSATE SODIUM 8.6-50 MG PO TABS
2.0000 | ORAL_TABLET | ORAL | Status: DC
  Administered 2019-06-25: 2 via ORAL
  Filled 2019-06-25: qty 2

## 2019-06-25 MED ORDER — OXYTOCIN 40 UNITS IN NORMAL SALINE INFUSION - SIMPLE MED
2.5000 [IU]/h | INTRAVENOUS | Status: DC
  Filled 2019-06-25: qty 1000

## 2019-06-25 MED ORDER — PRENATAL MULTIVITAMIN CH
1.0000 | ORAL_TABLET | Freq: Every day | ORAL | Status: DC
  Administered 2019-06-26: 1 via ORAL
  Filled 2019-06-25: qty 1

## 2019-06-25 MED ORDER — TERBUTALINE SULFATE 1 MG/ML IJ SOLN: 0.2500 mg | Freq: Once | INTRAMUSCULAR | Status: DC | PRN

## 2019-06-25 MED ORDER — DIPHENHYDRAMINE HCL 50 MG/ML IJ SOLN: 12.5000 mg | INTRAMUSCULAR | Status: DC | PRN

## 2019-06-25 MED ORDER — EPHEDRINE 5 MG/ML INJ: 10.0000 mg | INTRAVENOUS | Status: DC | PRN

## 2019-06-25 MED ORDER — LIDOCAINE HCL (PF) 1 % IJ SOLN: 30.0000 mL | INTRAMUSCULAR | Status: DC | PRN

## 2019-06-25 MED ORDER — DIBUCAINE (PERIANAL) 1 % EX OINT: 1.0000 "application " | TOPICAL_OINTMENT | CUTANEOUS | Status: DC | PRN

## 2019-06-25 MED ORDER — LACTATED RINGERS IV SOLN
INTRAVENOUS | Status: DC
  Administered 2019-06-25 (×2): via INTRAVENOUS

## 2019-06-25 MED ORDER — FLEET ENEMA 7-19 GM/118ML RE ENEM: 1.0000 | ENEMA | RECTAL | Status: DC | PRN

## 2019-06-25 MED ORDER — ACETAMINOPHEN 325 MG PO TABS: 650.0000 mg | ORAL_TABLET | ORAL | Status: DC | PRN

## 2019-06-25 MED ORDER — BUTORPHANOL TARTRATE 1 MG/ML IJ SOLN: 1.0000 mg | INTRAMUSCULAR | Status: DC | PRN

## 2019-06-25 MED ORDER — ONDANSETRON HCL 4 MG/2ML IJ SOLN: 4.0000 mg | INTRAMUSCULAR | Status: DC | PRN

## 2019-06-25 MED ORDER — TETANUS-DIPHTH-ACELL PERTUSSIS 5-2.5-18.5 LF-MCG/0.5 IM SUSP: 0.5000 mL | Freq: Once | INTRAMUSCULAR | Status: DC

## 2019-06-25 MED ORDER — ZOLPIDEM TARTRATE 5 MG PO TABS: 5.0000 mg | ORAL_TABLET | Freq: Every evening | ORAL | Status: DC | PRN

## 2019-06-25 MED ORDER — COCONUT OIL OIL: 1.0000 "application " | TOPICAL_OIL | Status: DC | PRN

## 2019-06-25 MED ORDER — MISOPROSTOL 25 MCG QUARTER TABLET
25.0000 ug | ORAL_TABLET | ORAL | Status: DC | PRN
  Administered 2019-06-25: 01:00:00 25 ug via VAGINAL
  Filled 2019-06-25: qty 1

## 2019-06-25 MED ORDER — BENZOCAINE-MENTHOL 20-0.5 % EX AERO: 1.0000 "application " | INHALATION_SPRAY | CUTANEOUS | Status: DC | PRN

## 2019-06-25 MED ORDER — IBUPROFEN 600 MG PO TABS
600.0000 mg | ORAL_TABLET | Freq: Four times a day (QID) | ORAL | Status: DC
  Administered 2019-06-25 – 2019-06-26 (×4): 600 mg via ORAL
  Filled 2019-06-25 (×4): qty 1

## 2019-06-25 MED ORDER — OXYCODONE-ACETAMINOPHEN 5-325 MG PO TABS: 2.0000 | ORAL_TABLET | ORAL | Status: DC | PRN

## 2019-06-25 MED ORDER — WITCH HAZEL-GLYCERIN EX PADS: 1.0000 "application " | MEDICATED_PAD | CUTANEOUS | Status: DC | PRN

## 2019-06-25 MED ORDER — LACTATED RINGERS IV SOLN
500.0000 mL | Freq: Once | INTRAVENOUS | Status: AC
  Administered 2019-06-25: 500 mL via INTRAVENOUS

## 2019-06-25 MED ORDER — SOD CITRATE-CITRIC ACID 500-334 MG/5ML PO SOLN: 30.0000 mL | ORAL | Status: DC | PRN

## 2019-06-25 MED ORDER — SODIUM CHLORIDE (PF) 0.9 % IJ SOLN
INTRAMUSCULAR | Status: DC | PRN
  Administered 2019-06-25: 12 mL/h via EPIDURAL

## 2019-06-25 MED ORDER — OXYCODONE HCL 5 MG PO TABS: 10.0000 mg | ORAL_TABLET | ORAL | Status: DC | PRN

## 2019-06-25 MED ORDER — FENTANYL-BUPIVACAINE-NACL 0.5-0.125-0.9 MG/250ML-% EP SOLN: 12.0000 mL/h | EPIDURAL | Status: DC | PRN

## 2019-06-25 MED ORDER — OXYCODONE-ACETAMINOPHEN 5-325 MG PO TABS: 1.0000 | ORAL_TABLET | ORAL | Status: DC | PRN

## 2019-06-25 MED ORDER — DIPHENHYDRAMINE HCL 25 MG PO CAPS: 25.0000 mg | ORAL_CAPSULE | Freq: Four times a day (QID) | ORAL | Status: DC | PRN

## 2019-06-25 MED ORDER — ONDANSETRON HCL 4 MG/2ML IJ SOLN: 4.0000 mg | Freq: Four times a day (QID) | INTRAMUSCULAR | Status: DC | PRN

## 2019-06-25 NOTE — Anesthesia Postprocedure Evaluation (Signed)
Anesthesia Post Note  Patient: Sue Thornton  Procedure(s) Performed: AN AD HOC LABOR EPIDURAL     Patient location during evaluation: Mother Baby Anesthesia Type: Epidural Level of consciousness: awake Pain management: satisfactory to patient Vital Signs Assessment: post-procedure vital signs reviewed and stable Respiratory status: spontaneous breathing Cardiovascular status: stable Anesthetic complications: no    Last Vitals:  Vitals:   06/25/19 1430 06/25/19 1535  BP: 115/63 123/73  Pulse: 72 73  Resp: 18 16  Temp: 37.1 C 36.8 C    Last Pain:  Vitals:   06/25/19 1730  TempSrc:   PainSc: 0-No pain   Pain Goal:                   [employer]

## 2019-06-25 NOTE — Anesthesia Procedure Notes (Addendum)
Epidural Patient location during procedure: OB Start time: 06/25/2019 9:39 AM End time: 06/25/2019 9:49 AM  Staffing Anesthesiologist: Barnet Glasgow, MD Performed: anesthesiologist   Preanesthetic Checklist Completed: patient identified, site marked, surgical consent, pre-op evaluation, timeout performed, IV checked, risks and benefits discussed and monitors and equipment checked  Epidural Patient position: sitting Prep: site prepped and draped and DuraPrep Patient monitoring: continuous pulse ox and blood pressure Approach: midline Injection technique: LOR air  Needle:  Needle type: Tuohy  Needle gauge: 17 G Needle length: 9 cm and 9 Needle insertion depth: 4 cm Catheter type: closed end flexible Catheter size: 19 Gauge Catheter at skin depth: 10 cm Test dose: negative  Assessment Events: blood not aspirated, injection not painful, no injection resistance, negative IV test and no paresthesia  Additional Notes Patient identified. Risks/Benefits/Options discussed with patient including but not limited to bleeding, infection, nerve damage, paralysis, failed block, incomplete pain control, headache, blood pressure changes, nausea, vomiting, reactions to medication both or allergic, itching and postpartum back pain. Confirmed with bedside nurse the patient's most recent platelet count. Confirmed with patient that they are not currently taking any anticoagulation, have any bleeding history or any family history of bleeding disorders. Patient expressed understanding and wished to proceed. All questions were answered. Sterile technique was used throughout the entire procedure. Please see nursing notes for vital signs. Test dose was given through epidural needle and negative prior to continuing to dose epidural or start infusion. Warning signs of high block given to the patient including shortness of breath, tingling/numbness in hands, complete motor block, or any concerning symptoms with  instructions to call for help. Patient was given instructions on fall risk and not to get out of bed. All questions and concerns addressed with instructions to call with any issues. 1 Attempt (S) . Patient tolerated procedure well.

## 2019-06-25 NOTE — H&P (Signed)
Sue Thornton is a 28 y.o.G2P1001 female presenting at 8140 2/[redacted]wks gestation for induction of labor. Pt is dated per a 5week US. She had a relatively benign prenatal course - reflux managed with protonix. She had a negative essential panel and first trimester screen. She is GBS and sars covid negative Pt received one dose of  cytotec overnight and begun having contractions. On exam she is 3.5cm/70/-2 and AROM noted clear fluid. She is considering an epidural. She had a third degree tear with first child OB History    Gravida  2   Para  1   Term  1   Preterm      AB      Living  1     SAB      TAB      Ectopic      Multiple  0   Live Births  1          Past Medical History:  Diagnosis Date  . Medical history non-contributory    Past Surgical History:  Procedure Laterality Date  . NO PAST SURGERIES     Family History: family history includes Asthma in her sister; Breast cancer in her maternal aunt and maternal grandmother; Hypertension in her father; Thyroid disease in her mother. Social History:  reports that she has never smoked. She has never used smokeless tobacco. She reports that she does not drink alcohol or use drugs.     Maternal Diabetes: No Genetic Screening: Normal Maternal Ultrasounds/Referrals: Normal Fetal Ultrasounds or other Referrals:  None Maternal Substance Abuse:  No Significant Maternal Medications:  Meds include: Protonix Significant Maternal Lab Results:  Group B Strep negative Other Comments:  None  Review of Systems  Constitutional: Negative for chills, fever, malaise/fatigue and weight loss.  Eyes: Negative for blurred vision.  Respiratory: Negative for shortness of breath.   Cardiovascular: Negative for chest pain.  Gastrointestinal: Negative for heartburn, nausea and vomiting.  Genitourinary: Negative for dysuria.  Musculoskeletal: Positive for myalgias.  Skin: Negative for itching and rash.  Neurological: Negative for dizziness  and headaches.  Endo/Heme/Allergies: Does not bruise/bleed easily.  Psychiatric/Behavioral: Negative for depression, hallucinations, substance abuse and suicidal ideas. The patient is not nervous/anxious.    Maternal Medical History:  Reason for admission: Nausea. Postdates iol  Contractions: Onset was 3-5 hours ago.   Frequency: regular.   Perceived severity is moderate.    Fetal activity: Perceived fetal activity is normal.   Last perceived fetal movement was within the past hour.    Prenatal complications: no prenatal complications Prenatal Complications - Diabetes: none.    Dilation: 3.5 Effacement (%): 70 Station: -1 Exam by:: Milan Perkins Blood pressure 121/69, pulse 86, temperature 98.3 F (36.8 C), temperature source Oral, resp. rate 16, height 5\' 8"  (1.727 m), weight 86.9 kg, unknown if currently breastfeeding. Maternal Exam:  Uterine Assessment: Contraction strength is moderate.  Contraction frequency is regular.   Abdomen: Patient reports generalized tenderness.  Fundal height is AGA.   Estimated fetal weight is AGA.   Fetal presentation: vertex  Introitus: Normal vulva. Vulva is negative for condylomata and lesion.  Vagina is positive for edema.  Vagina is negative for condylomata.  Amniotic fluid character: clear.  Pelvis: adequate for delivery.   Cervix: Cervix evaluated by digital exam.     Fetal Exam Fetal Monitor Review: Baseline rate: 135.  Variability: moderate (6-25 bpm).   Pattern: accelerations present.    Fetal State Assessment: Category I - tracings are normal.  Physical Exam  Constitutional: She is oriented to person, place, and time. She appears well-developed and well-nourished.  Neck: Normal range of motion.  Cardiovascular: Normal rate.  Respiratory: Effort normal.  GI: Soft. There is generalized abdominal tenderness.  Genitourinary:    Vulva and vagina normal.     No vulval condylomata or lesion noted.   Musculoskeletal: Normal  range of motion.  Neurological: She is alert and oriented to person, place, and time.  Skin: Skin is warm.  Psychiatric: She has a normal mood and affect. Her behavior is normal. Judgment and thought content normal.    Prenatal labs: ABO, Rh: --/--/O POS (07/13 0035) Antibody: NEG (07/13 0035) Rubella: Immune (12/10 0000) RPR: Nonreactive (12/10 0000)  HBsAg: Negative (12/10 0000)  HIV: Non-reactive (12/10 0000)  GBS: Negative (06/19 0000)   Assessment/Plan: 27yo g2P1001 at 28 2/[redacted]wks gestation for iol - stable S/p Cytotec x 1 AROM performed - clear fluid noted GBS negative Pain control prn Anticipate svd   Mukhtar Shams W Kallista Pae 06/25/2019, 9:07 AM

## 2019-06-25 NOTE — Anesthesia Preprocedure Evaluation (Signed)
Anesthesia Evaluation  Patient identified by MRN, date of birth, ID band Patient awake    Reviewed: Allergy & Precautions, NPO status , Patient's Chart, lab work & pertinent test results  Airway Mallampati: II  TM Distance: >3 FB Neck ROM: Full    Dental no notable dental hx. (+) Teeth Intact   Pulmonary neg pulmonary ROS,    Pulmonary exam normal breath sounds clear to auscultation       Cardiovascular Exercise Tolerance: Good negative cardio ROS Normal cardiovascular exam Rhythm:Regular Rate:Normal     Neuro/Psych negative neurological ROS     GI/Hepatic   Endo/Other    Renal/GU      Musculoskeletal   Abdominal   Peds  Hematology   Anesthesia Other Findings   Reproductive/Obstetrics (+) Pregnancy                             Lab Results  Component Value Date   WBC 11.0 (H) 06/25/2019   HGB 11.6 (L) 06/25/2019   HCT 36.0 06/25/2019   MCV 88.7 06/25/2019   PLT 274 06/25/2019    Anesthesia Physical Anesthesia Plan  ASA: II  Anesthesia Plan: Epidural   Post-op Pain Management:    Induction:   PONV Risk Score and Plan:   Airway Management Planned:   Additional Equipment:   Intra-op Plan:   Post-operative Plan:   Informed Consent: I have reviewed the patients History and Physical, chart, labs and discussed the procedure including the risks, benefits and alternatives for the proposed anesthesia with the patient or authorized representative who has indicated his/her understanding and acceptance.       Plan Discussed with:   Anesthesia Plan Comments:         Anesthesia Quick Evaluation

## 2019-06-25 NOTE — Lactation Note (Signed)
This note was copied from a baby's chart. Lactation Consultation Note  Patient Name: Sue Thornton [id number] Date: 06/25/2019 Reason for consult: Initial assessment;Term  P2 mother whose infant is now 31 hours old.  Mother breast fed her first child (now 44 months old) for 9 months.  Baby was asleep in father's arms when I arrived.  Mother had no questions/concerns related to breast feeding.  Encouraged to feed 8-12 times/24 hours or sooner if baby shows feeding cues.  Reviewed cues.  Mother will do hand expression before/after feedings to help increase milk supply.  Colostrum container provided and milk storage times reviewed.  Finger feeding demonstrated.  Mom made aware of O/P services, breastfeeding support groups, community resources, and our phone # for post-discharge questions. Mother will call for latch assistance as needed.  Mother has a DEBP for home use and will return to work after maternity leave.     Maternal Data Formula Feeding for Exclusion: No Has patient been taught Hand Expression?: Yes Does the patient have breastfeeding experience prior to this delivery?: Yes  Feeding Feeding Type: Breast Fed  LATCH Score                   Interventions    Lactation Tools Discussed/Used     Consult Status Consult Status: Follow-up Date: 06/26/19 Follow-up type: In-patient    Little Ishikawa 06/25/2019, 4:54 PM

## 2019-06-26 LAB — CBC
HCT: 33.1 % — ABNORMAL LOW (ref 36.0–46.0)
Hemoglobin: 10.7 g/dL — ABNORMAL LOW (ref 12.0–15.0)
MCH: 28.5 pg (ref 26.0–34.0)
MCHC: 32.3 g/dL (ref 30.0–36.0)
MCV: 88 fL (ref 80.0–100.0)
Platelets: 216 10*3/uL (ref 150–400)
RBC: 3.76 MIL/uL — ABNORMAL LOW (ref 3.87–5.11)
RDW: 13.2 % (ref 11.5–15.5)
WBC: 12.2 10*3/uL — ABNORMAL HIGH (ref 4.0–10.5)
nRBC: 0 % (ref 0.0–0.2)

## 2019-06-26 MED ORDER — ACETAMINOPHEN 325 MG PO TABS: 650.0000 mg | ORAL_TABLET | ORAL | 0 refills | Status: AC | PRN

## 2019-06-26 MED ORDER — IBUPROFEN 600 MG PO TABS: 600.0000 mg | ORAL_TABLET | Freq: Four times a day (QID) | ORAL | 0 refills | Status: AC

## 2019-06-26 NOTE — Discharge Summary (Signed)
OB Discharge Summary     Patient Name: Sue Thornton DOB: [birth year] MRN: [id number]  Date of admission: 06/25/2019 Delivering MD: Pryor OchoaBANGA, CECILIA [facility]   Date of discharge: 06/26/2019  Admitting diagnosis: pregnancy Intrauterine pregnancy: 1260w2d     Secondary diagnosis:  Active Problems:   Post-dates pregnancy   SVD (spontaneous vaginal delivery)   Postpartum care following vaginal delivery  Additional problems: none     Discharge diagnosis: Term Pregnancy Delivered                                                                                                Post partum procedures:none  Augmentation: AROM, Pitocin and Cytotec  Complications: None  Hospital course:  Induction of Labor With Vaginal Delivery   28 y.o. yo [id number] at 160w2d was admitted to the hospital 06/25/2019 for induction of labor.  Indication for induction: Favorable cervix at term.  Patient had an uncomplicated labor course as follows: Membrane Rupture Time/Date: 8:48 AM ,06/25/2019   Intrapartum Procedures: Episiotomy: None [1]                                         Lacerations:  2nd degree [3]  Patient had delivery of a Viable infant.  Information for the patient's newborn:  Stevie KernHicks, Boy Rheta [[id number]][[id number]]  Delivery Method: Vaginal, Spontaneous(Filed from Delivery Summary)    06/25/2019  Details of delivery can be found in separate delivery note.  Patient had a routine postpartum course. Patient is discharged home 06/26/19.  Physical exam  Vitals:   06/25/19 1535 06/25/19 1940 06/25/19 2330 06/26/19 0350  BP: 123/73 111/61 115/73 111/70  Pulse: 73 83 96 97  Resp: 16 16 18 18   Temp: 98.3 F (36.8 C) 98.3 F (36.8 C) 98.1 F (36.7 C) 98.1 F (36.7 C)  TempSrc: Oral Oral Oral Oral  SpO2:  99% 100% 99%  Weight:      Height:       General: alert and cooperative Lochia: appropriate Uterine Fundus: firm  Labs: Lab Results  Component Value Date   WBC 12.2 (H) 06/26/2019   HGB 10.7 (L)  06/26/2019   HCT 33.1 (L) 06/26/2019   MCV 88.0 06/26/2019   PLT 216 06/26/2019   No flowsheet data found.  Discharge instruction: per After Visit Summary and "Baby and Me Booklet".  After visit meds:  Allergies as of 06/26/2019   No Known Allergies     Medication List    TAKE these medications   acetaminophen 325 MG tablet Commonly known as: Tylenol Take 2 tablets (650 mg total) by mouth every 4 (four) hours as needed (for pain scale < 4).   ibuprofen 600 MG tablet Commonly known as: ADVIL Take 1 tablet (600 mg total) by mouth every 6 (six) hours.   pantoprazole 40 MG tablet Commonly known as: PROTONIX Take 40 mg by mouth daily as needed (for heartburn/reflux).   prenatal multivitamin Tabs tablet Take 1 tablet by mouth daily at 12 noon.  Diet: routine diet  Activity: Advance as tolerated. Pelvic rest for 6 weeks.   Outpatient follow up:6 weeks Follow up Appt:No future appointments. Follow up Visit:No follow-ups on file.  Postpartum contraception: IUD Mirena  Newborn Data: Live born female  Birth Weight: 8 lb 3 oz (3715 g) APGAR: 8, 9  Newborn Delivery   Birth date/time: 06/25/2019 12:24:00 Delivery type: Vaginal, Spontaneous      Baby Feeding: Breast Disposition:home with mother  Plans circumcision with pediatrician   06/26/2019 Logan Bores, MD

## 2019-06-26 NOTE — Lactation Note (Signed)
This note was copied from a baby's chart. Lactation Consultation Note  Patient Name: Sue Thornton [id number] Date: 06/26/2019 Reason for consult: Follow-up assessment   P2, Ex BF has baby latched upon entering. Encouraged breast compression to keep baby active. Feed on demand approximately 8-12 times per day.   Reviewed engorgement care and monitoring voids/stools.    Maternal Data    Feeding Feeding Type: Breast Fed  LATCH Score Latch: Grasps breast easily, tongue down, lips flanged, rhythmical sucking.(latched upon entering)  Audible Swallowing: A few with stimulation  Type of Nipple: Everted at rest and after stimulation  Comfort (Breast/Nipple): Soft / non-tender  Hold (Positioning): No assistance needed to correctly position infant at breast.  LATCH Score: 9  Interventions Interventions: Breast feeding basics reviewed  Lactation Tools Discussed/Used     Consult Status Consult Status: Complete Date: 06/26/19    Vivianne Master [facility] 06/26/2019, 9:38 AM

## 2019-06-26 NOTE — Progress Notes (Signed)
Patient ID: Sue Thornton, female   DOB: [birth year], 28 y.o.   MRN: [id number] Pt doing great, requesting early d/c today, normal lochia Breastfeeding going well  afeb vss  Fundus firm  D/c home if baby able to go Planning IUD

## 2019-08-07 IMAGING — US US MFM FETAL BPP W/O NON-STRESS
1 series · 12 of 27 positions shown · non-contrast
Comparison: none

[Series 1: us mfm fetal bpp w/o non-stress · 27 acquisitions, 12 frames shown]
[im 2/27]
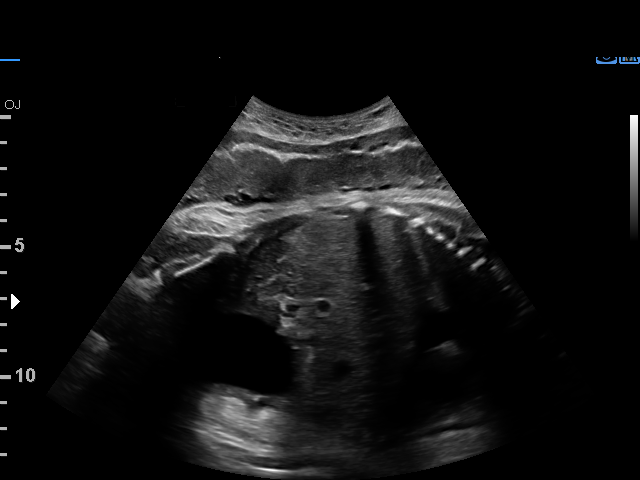
[im 4/27]
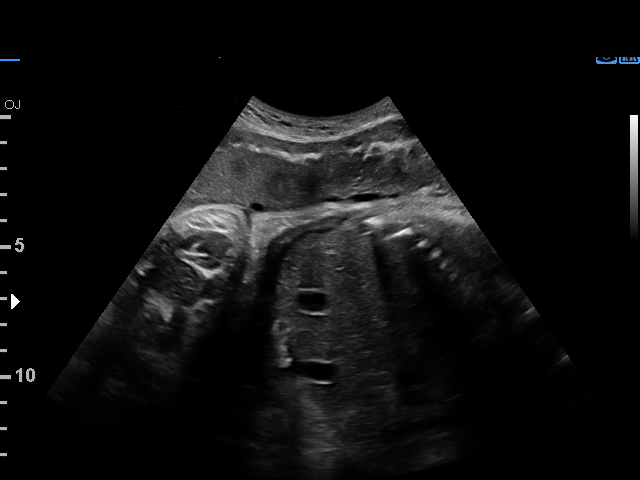
[im 6/27]
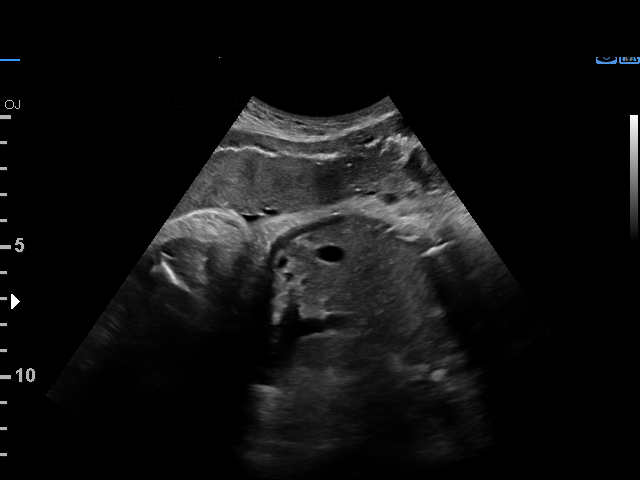
[im 8/27]
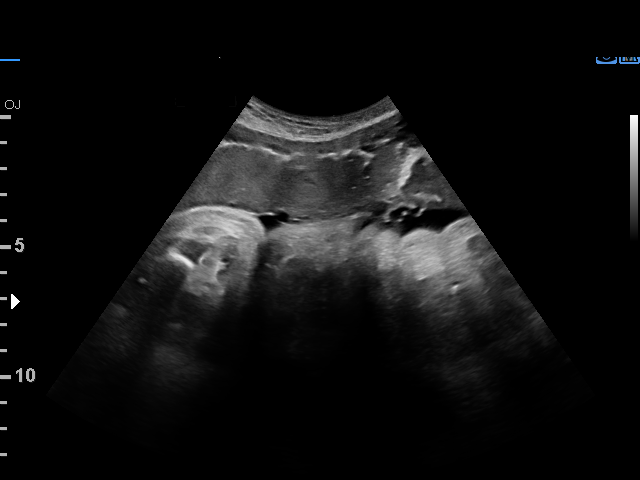
[im 11/27]
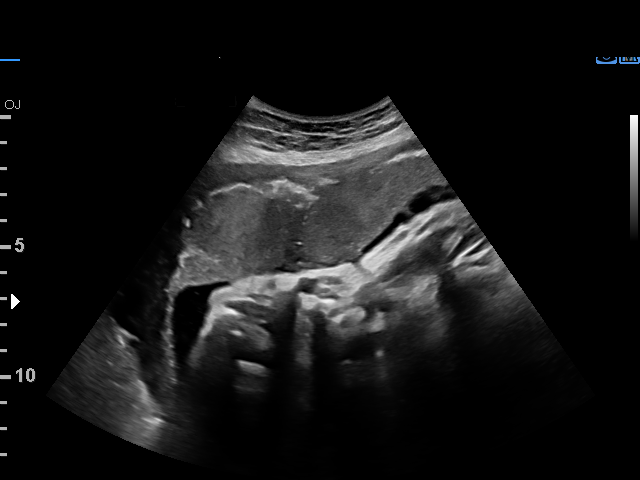
[im 13/27]
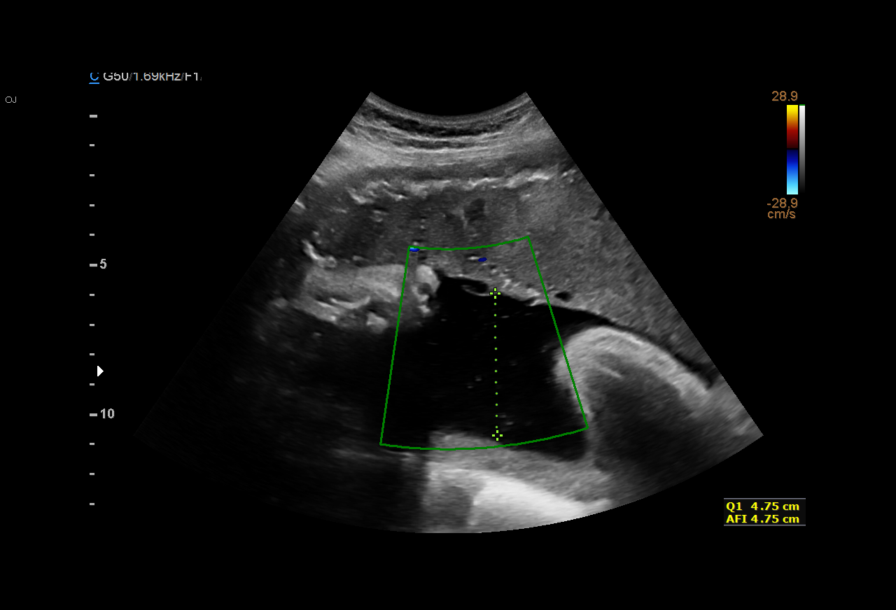
[im 15/27]
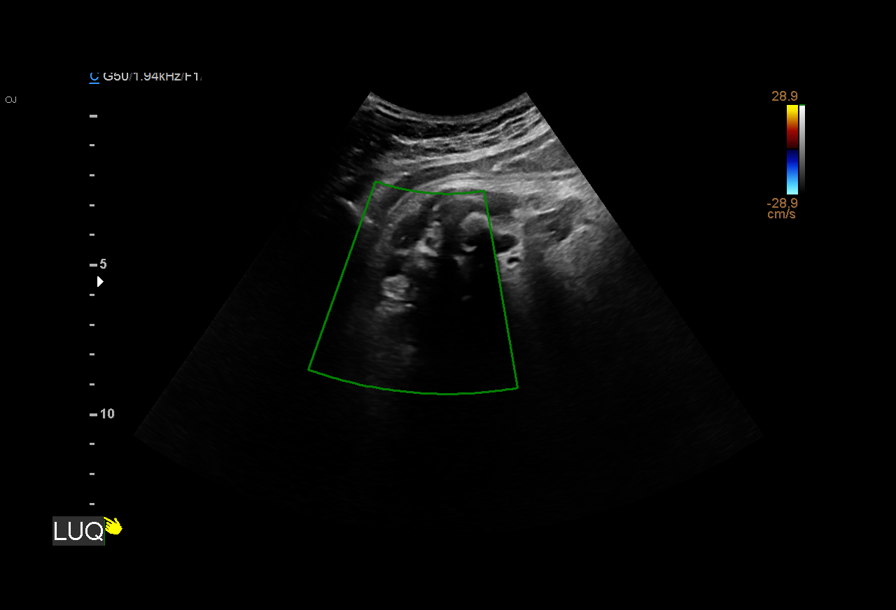
[im 17/27]
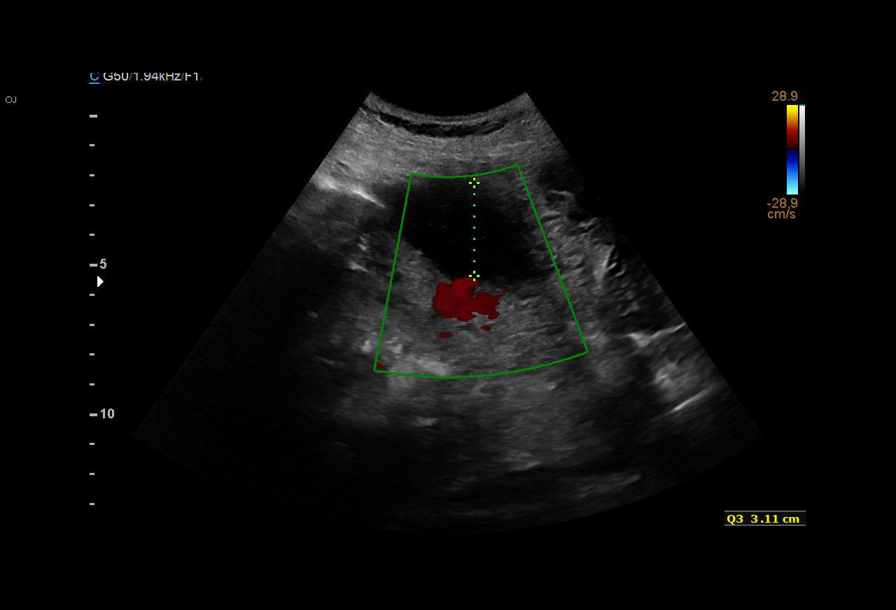
[im 20/27]
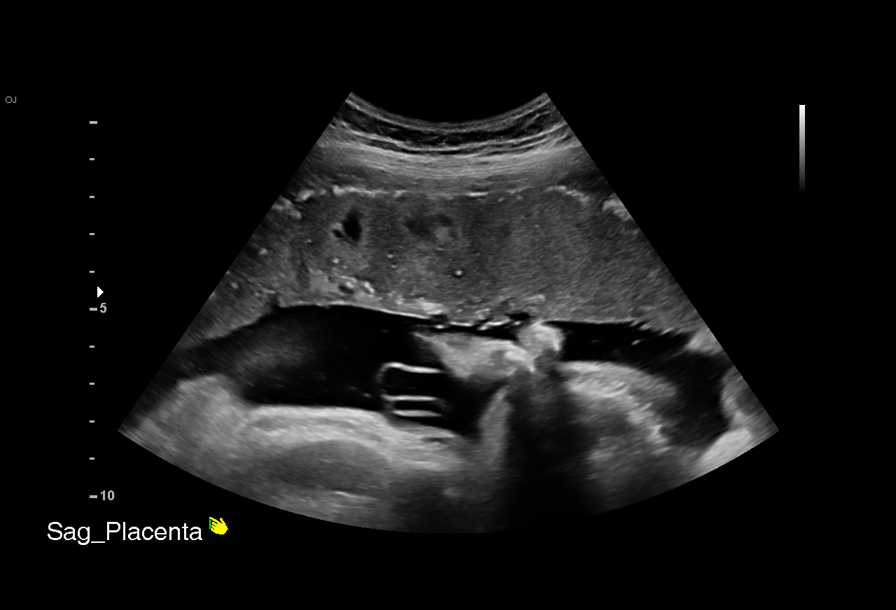
[im 22/27]
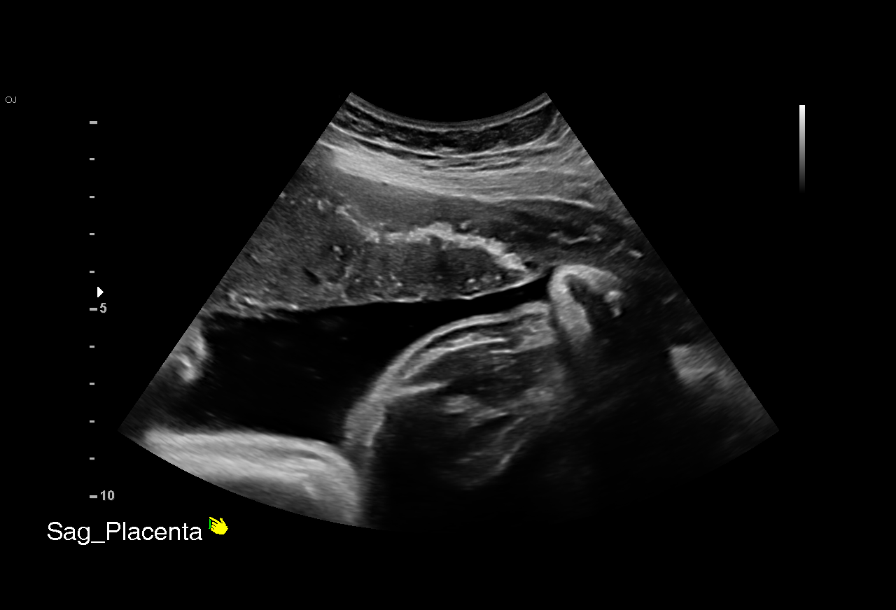
[im 24/27]
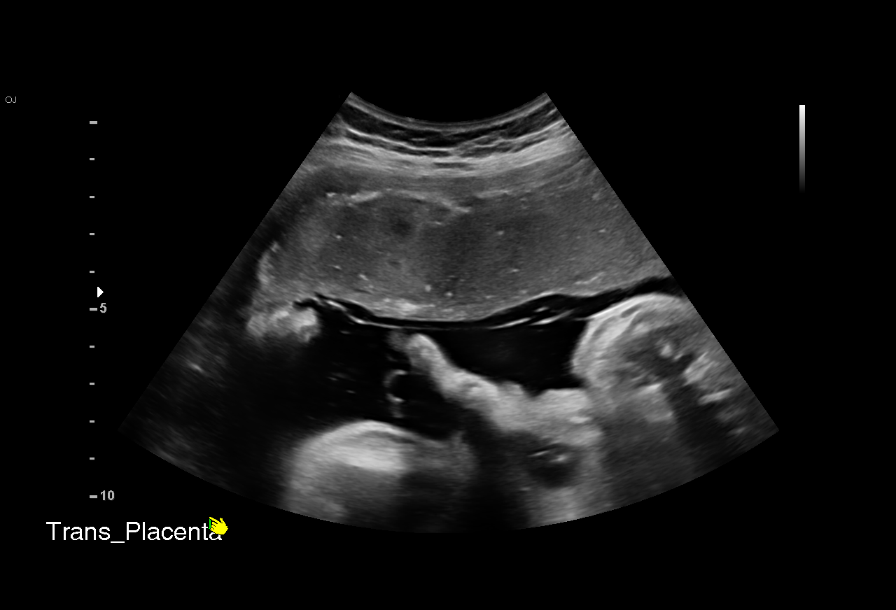
[im 26/27]
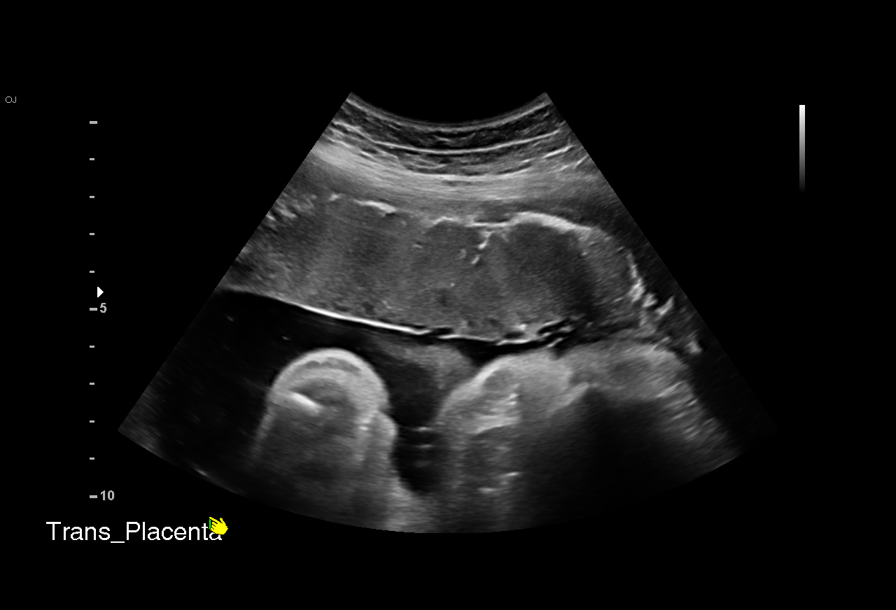

[12 of 27 positions shown; findings below may reference images not displayed]

[REDACTED]
Ave[address] [HOSPITAL]

1  [place]                [id number]      [id number]     [id number]
Indications

41 weeks gestation of pregnancy
Postdate pregnancy (40-42 weeks)
Fetal Evaluation

Num Of Fetuses:     1
Fetal Heart         164
Rate(bpm):
Cardiac Activity:   Observed
Presentation:       Cephalic

Amniotic Fluid
AFI FV:      Subjectively within normal limits

AFI Sum(cm)     %Tile       Largest Pocket(cm)
11.7            51

RUQ(cm)                     LUQ(cm)        LLQ(cm)
4.75
Biophysical Evaluation

Amniotic F.V:   Within normal limits       F. Tone:        Observed
F. Movement:    Observed                   Score:          [DATE]
F. Breathing:   Observed
Gestational Age

Clinical EDD:  41w 0d                                        EDD:   01/31/18
Best:          41w 0d     Det. By:  Clinical EDD             EDD:   01/31/18
Impression

Single living intrauterine pregnancy at 41w 0d.
Cephalic presentation.
Normal amniotic fluid volume.
BPP [DATE].
Recommendations

Follow-up ultrasounds as clinically indicated.

## 2019-08-08 IMAGING — US US MFM FETAL BPP W/O NON-STRESS
1 series · 11 of 11 positions shown · non-contrast
Comparison: none

[Series 1: us mfm fetal bpp w/o non-stress · 11 acquisitions, 11 frames shown]
[im 1/11]
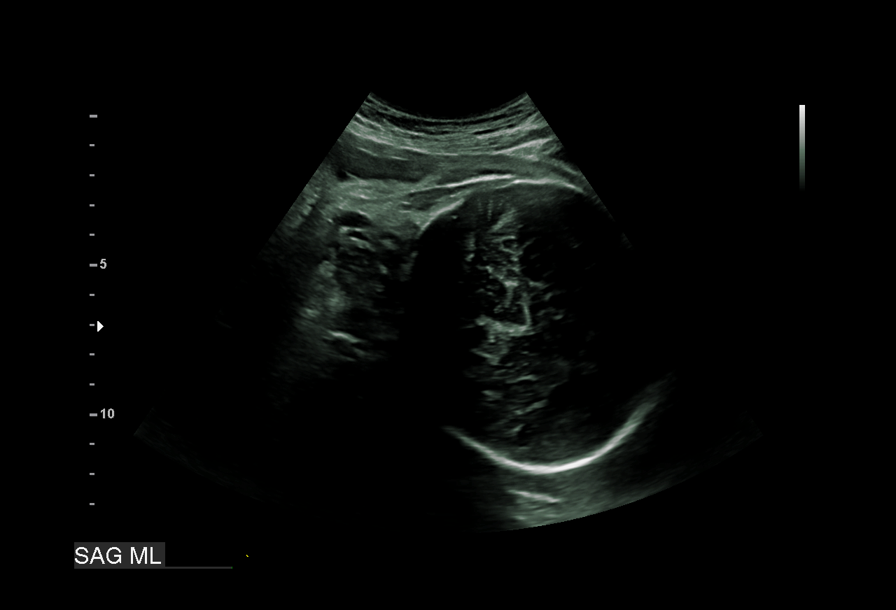
[im 2/11]
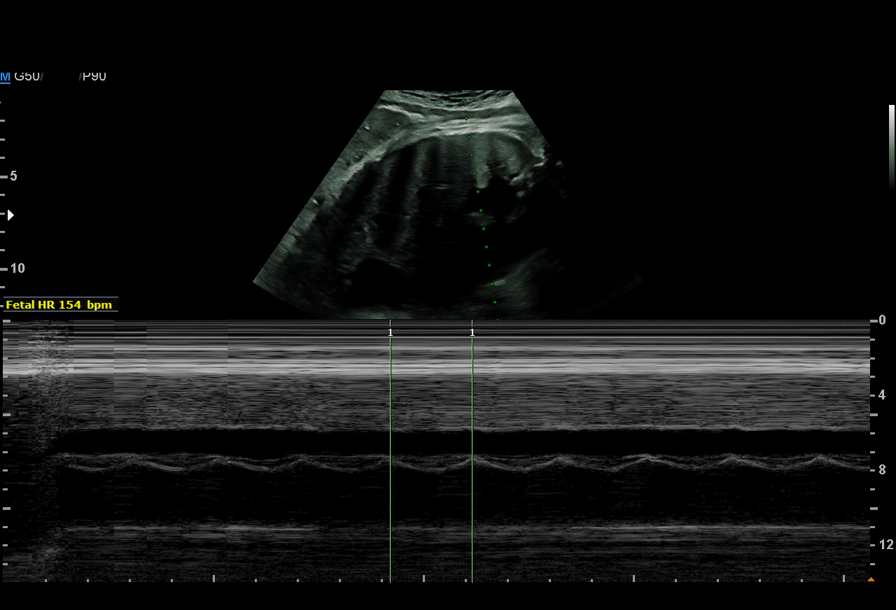
[im 3/11]
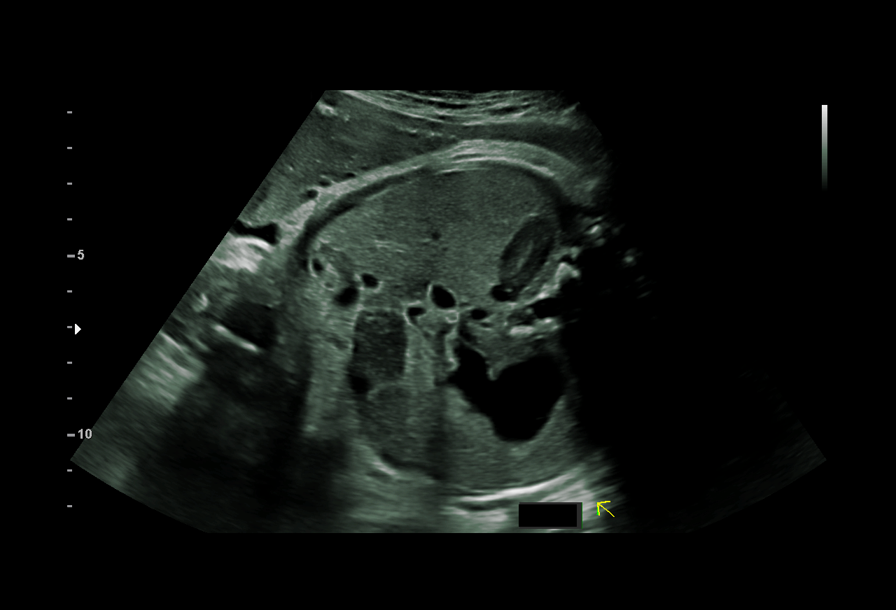
[im 4/11]
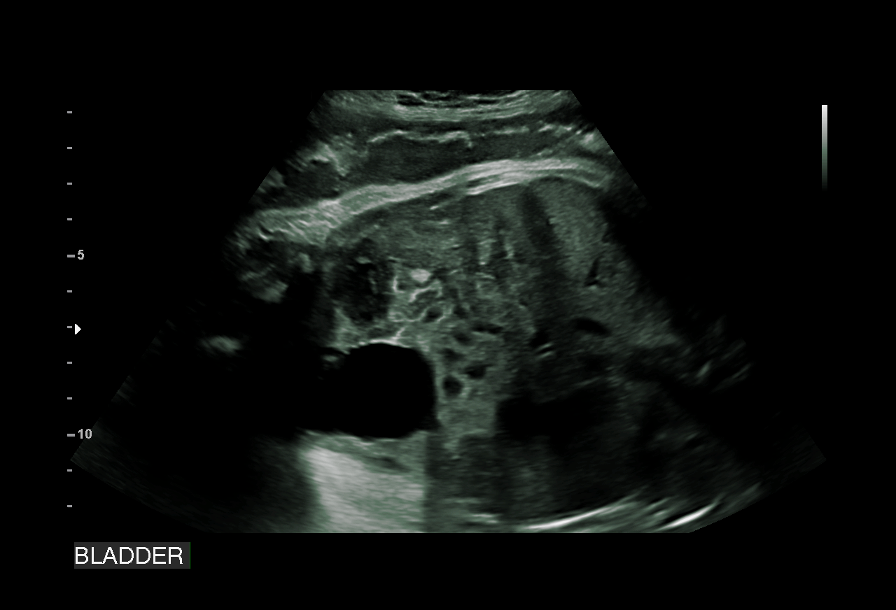
[im 5/11]
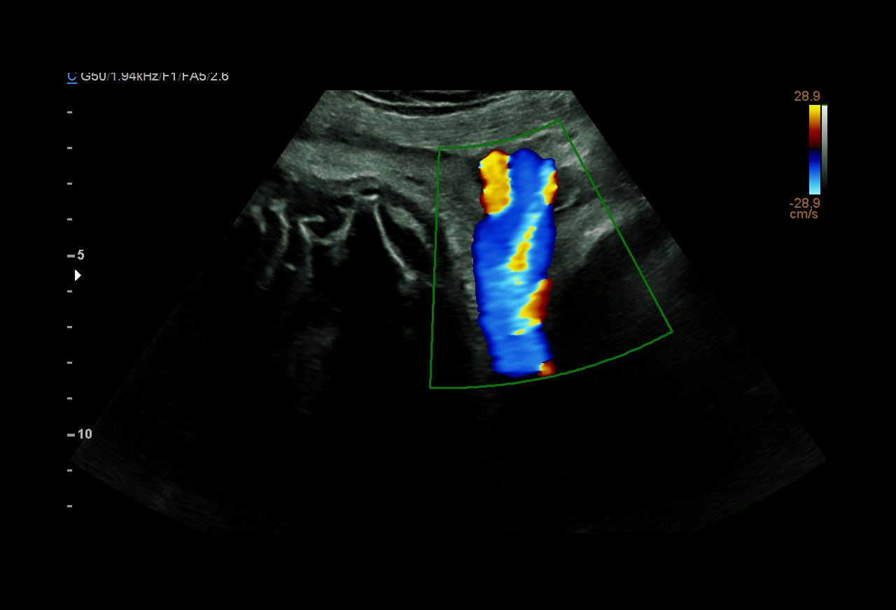
[im 6/11]
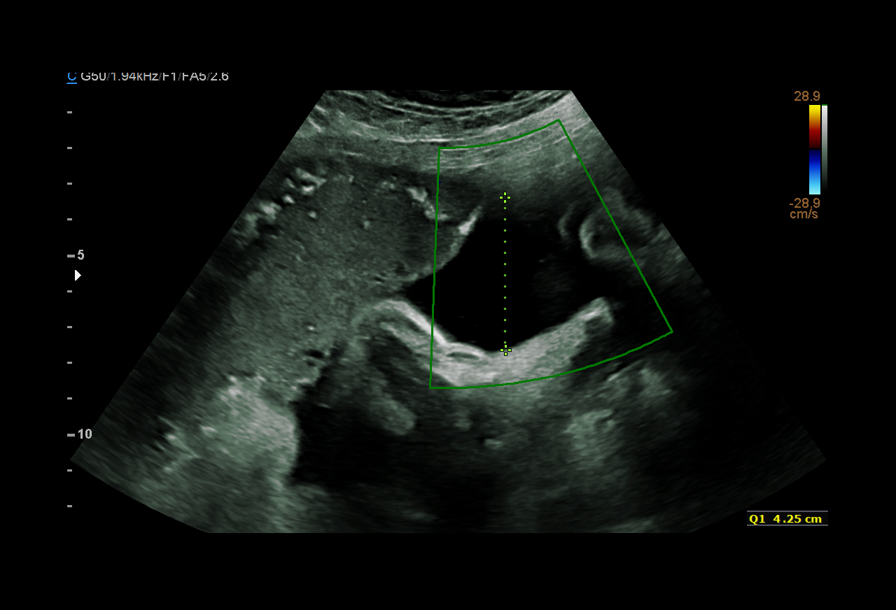
[im 7/11]
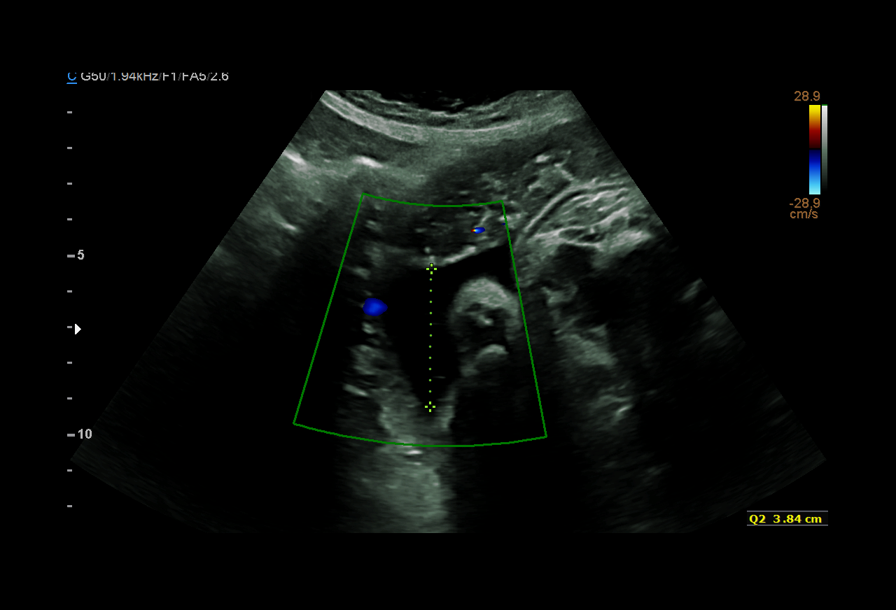
[im 8/11]
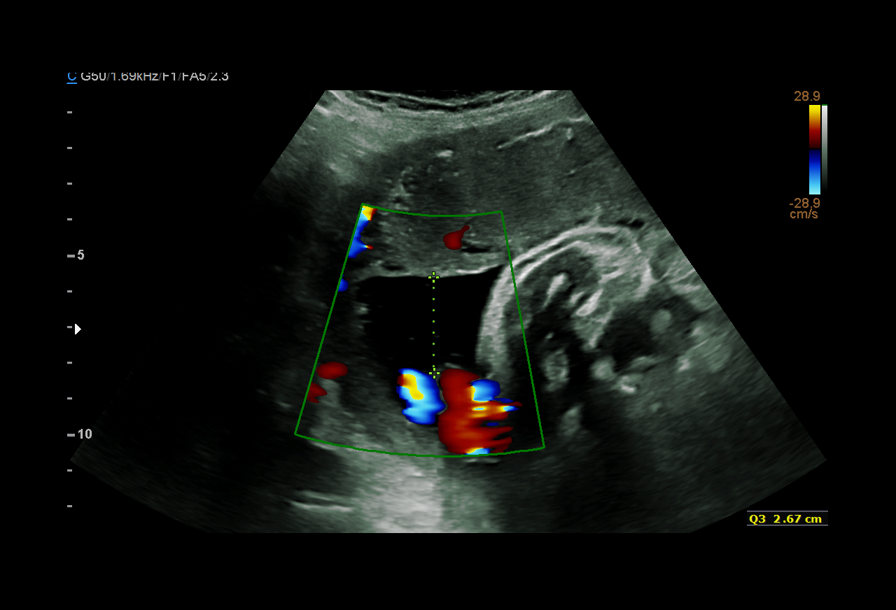
[im 9/11]
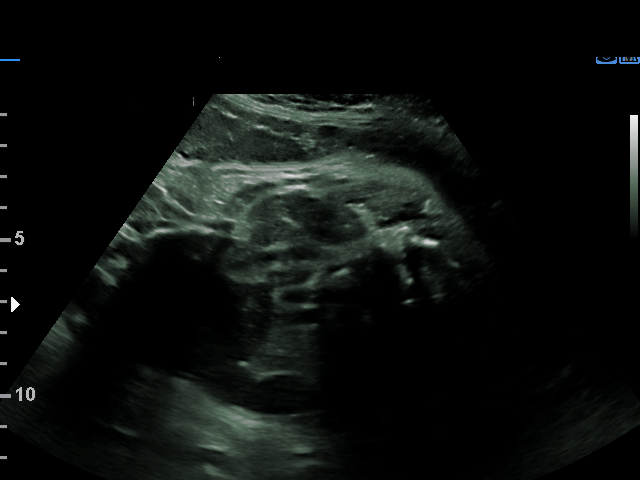
[im 10/11]
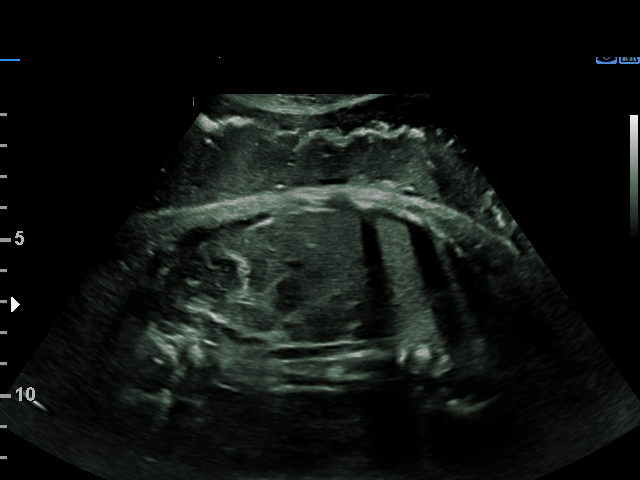
[im 11/11]
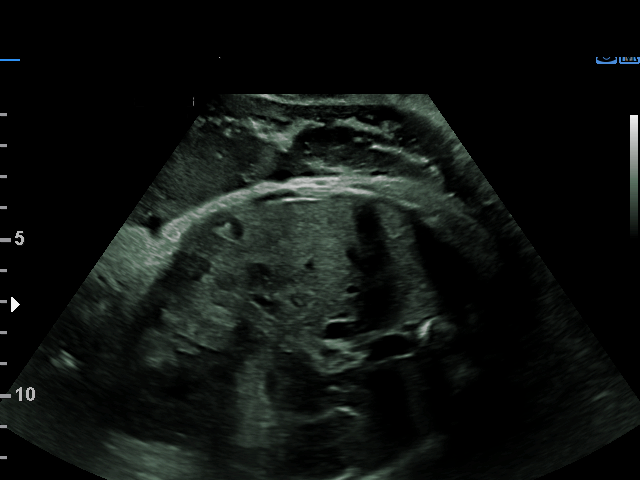

[11 of 11 positions shown; findings below may reference images not displayed]

[REDACTED]
Ave[address] [HOSPITAL]

1  [facility]            [id number]      [id number]     [id number]
Indications

41 weeks gestation of pregnancy
Equivocal NST
Postdate pregnancy (40-42 weeks)
Fetal Evaluation

Num Of Fetuses:     1
Fetal Heart         154
Rate(bpm):
Cardiac Activity:   Observed
Presentation:       Cephalic

Amniotic Fluid
AFI FV:      Subjectively low-normal

AFI Sum(cm)     %Tile       Largest Pocket(cm)
10.76           43

RUQ(cm)                     LUQ(cm)        LLQ(cm)
4.25
Biophysical Evaluation

Amniotic F.V:   Within normal limits       F. Tone:        Observed
F. Movement:    Observed                   Score:          [DATE]
F. Breathing:   Observed
Gestational Age

Clinical EDD:  41w 1d                                        EDD:   01/31/18
Best:          41w 1d     Det. By:  Clinical EDD             EDD:   01/31/18
Anatomy

Stomach:               Appears normal, left   Bladder:                Appears normal
sided
Impression

Single living intrauterine pregnancy at 41w 1d.
Cephalic presentation.
Normal amniotic fluid volume.
BPP [DATE].
Recommendations

Patient delivered.
